# Patient Record
Sex: Male | Born: 1935 | Race: Black or African American | Hispanic: No | Marital: Married | Smoking: Never smoker
Health system: Southern US, Community
[De-identification: ages and names within clinical notes are randomized; demographics above are authoritative.]

## PROBLEM LIST (undated history)

## (undated) DIAGNOSIS — M1990 Unspecified osteoarthritis, unspecified site: Secondary | ICD-10-CM

## (undated) DIAGNOSIS — I10 Essential (primary) hypertension: Secondary | ICD-10-CM

## (undated) DIAGNOSIS — I712 Thoracic aortic aneurysm, without rupture: Secondary | ICD-10-CM

## (undated) DIAGNOSIS — I7121 Aneurysm of the ascending aorta, without rupture: Secondary | ICD-10-CM

## (undated) DIAGNOSIS — E785 Hyperlipidemia, unspecified: Secondary | ICD-10-CM

## (undated) DIAGNOSIS — C61 Malignant neoplasm of prostate: Secondary | ICD-10-CM

## (undated) DIAGNOSIS — I351 Nonrheumatic aortic (valve) insufficiency: Secondary | ICD-10-CM

## (undated) DIAGNOSIS — N189 Chronic kidney disease, unspecified: Secondary | ICD-10-CM

## (undated) HISTORY — DX: Thoracic aortic aneurysm, without rupture: I71.2

## (undated) HISTORY — DX: Chronic kidney disease, unspecified: N18.9

## (undated) HISTORY — DX: Nonrheumatic aortic (valve) insufficiency: I35.1

## (undated) HISTORY — PX: CATARACT EXTRACTION, BILATERAL: SHX1313

## (undated) HISTORY — DX: Hyperlipidemia, unspecified: E78.5

## (undated) HISTORY — DX: Aneurysm of the ascending aorta, without rupture: I71.21

## (undated) HISTORY — DX: Essential (primary) hypertension: I10

## (undated) HISTORY — DX: Malignant neoplasm of prostate: C61

## (undated) HISTORY — DX: Unspecified osteoarthritis, unspecified site: M19.90

---

## 2001-08-27 ENCOUNTER — Encounter: Payer: Self-pay | Admitting: Cardiothoracic Surgery

## 2001-08-27 ENCOUNTER — Encounter: Admission: RE | Admit: 2001-08-27 | Discharge: 2001-08-27 | Payer: Self-pay | Admitting: Cardiothoracic Surgery

## 2002-11-17 ENCOUNTER — Encounter: Payer: Self-pay | Admitting: Geriatric Medicine

## 2002-11-17 ENCOUNTER — Encounter: Admission: RE | Admit: 2002-11-17 | Discharge: 2002-11-17 | Payer: Self-pay | Admitting: Geriatric Medicine

## 2003-05-26 ENCOUNTER — Encounter: Payer: Self-pay | Admitting: Cardiothoracic Surgery

## 2003-05-26 ENCOUNTER — Encounter: Admission: RE | Admit: 2003-05-26 | Discharge: 2003-05-26 | Payer: Self-pay | Admitting: Cardiothoracic Surgery

## 2004-06-13 ENCOUNTER — Encounter: Admission: RE | Admit: 2004-06-13 | Discharge: 2004-06-13 | Payer: Self-pay | Admitting: Cardiothoracic Surgery

## 2005-06-10 ENCOUNTER — Encounter: Admission: RE | Admit: 2005-06-10 | Discharge: 2005-06-10 | Payer: Self-pay | Admitting: Cardiothoracic Surgery

## 2005-12-26 ENCOUNTER — Encounter: Admission: RE | Admit: 2005-12-26 | Discharge: 2005-12-26 | Payer: Self-pay | Admitting: Cardiothoracic Surgery

## 2006-12-17 ENCOUNTER — Encounter: Admission: RE | Admit: 2006-12-17 | Discharge: 2006-12-17 | Payer: Self-pay | Admitting: Cardiothoracic Surgery

## 2007-12-30 ENCOUNTER — Ambulatory Visit: Payer: Self-pay | Admitting: Cardiothoracic Surgery

## 2007-12-30 ENCOUNTER — Encounter: Admission: RE | Admit: 2007-12-30 | Discharge: 2007-12-30 | Payer: Self-pay | Admitting: Cardiothoracic Surgery

## 2008-12-15 ENCOUNTER — Ambulatory Visit: Payer: Self-pay | Admitting: Cardiothoracic Surgery

## 2010-01-11 ENCOUNTER — Encounter: Admission: RE | Admit: 2010-01-11 | Discharge: 2010-01-11 | Payer: Self-pay | Admitting: Cardiothoracic Surgery

## 2010-01-11 ENCOUNTER — Ambulatory Visit: Payer: Self-pay | Admitting: Cardiothoracic Surgery

## 2010-12-23 ENCOUNTER — Encounter: Payer: Self-pay | Admitting: Cardiothoracic Surgery

## 2011-01-23 ENCOUNTER — Other Ambulatory Visit: Payer: Self-pay | Admitting: Cardiothoracic Surgery

## 2011-01-23 DIAGNOSIS — I712 Thoracic aortic aneurysm, without rupture: Secondary | ICD-10-CM

## 2011-01-24 ENCOUNTER — Encounter (INDEPENDENT_AMBULATORY_CARE_PROVIDER_SITE_OTHER): Payer: Medicare Other | Admitting: Cardiothoracic Surgery

## 2011-01-24 ENCOUNTER — Ambulatory Visit
Admission: RE | Admit: 2011-01-24 | Discharge: 2011-01-24 | Disposition: A | Discharge status: Home / Self Care | Payer: Medicare Other | Source: Ambulatory Visit | Attending: Cardiothoracic Surgery | Admitting: Cardiothoracic Surgery

## 2011-01-24 DIAGNOSIS — I712 Thoracic aortic aneurysm, without rupture: Secondary | ICD-10-CM

## 2011-01-25 NOTE — Assessment & Plan Note (Addendum)
OFFICE VISIT  Corey Parker, Corey Parker DOB:  [birth year]                                        January 24, 2011 CHART #:  [id number]  The patient returns to the office today.  He has been followed since 1994 with a chronic type 3 aortic dissection and enlarged arch and descending aorta.  He comes in today, notes that he has no overt symptoms of heart failure.  He tries to run 3 blocks 3 days a week.  He was running 4 blocks, but he is unable to do this.  He denies shortness of breath.  He denies chest pain.  On exam, his blood pressure 135/80, pulse 86, respiratory rate 16, O2 sats 96%.  His lungs are clear bilaterally.  He is awake, alert, neurologically intact, appears younger than his stated age.  He is now 76.  He does appear slightly more feeble than he did last year when he was seen.  He has more of an early ejection aortic murmur than I had appreciated in the past.  He has no pedal edema.  Abdominal exam is benign without palpable masses.  A PA and lateral chest x-ray was done that shows dilatation of his aortic knob and descending aorta.  On the basis of the chest x-ray, it appears unchanged from chest x-ray last year.  I have again discussed with him the risks and options of major surgery in the past that we have entertained, possible stent grafting but the size of his arch, he would still require a major intervention.  He is currently not interested as he was last year with any major surgical intervention being fully aware that having a dilated aorta puts him at increased risk of rupture.  I have encouraged him to discuss this with his family as he has been adamant about not wishing to proceed with any operative intervention should something catastrophic occur.  Sheliah Plane, MD Electronically Signed  EG/MEDQ  D:  01/24/2011  T:  01/25/2011  Job:  [id number]  cc:   Lyn Records, M.D. Hal T. Pete Glatter, MD

## 2011-04-16 NOTE — Assessment & Plan Note (Signed)
OFFICE VISIT   Corey Parker, Corey Parker  DOB:  [birth year]                                        December 15, 2008  CHART #:  [id number]   The patient returns to the office today in followup.  I have followed  him since 1994 with a chronic type-3 aortic dissection.  He is now 77  years old and continues to remain very mobile.  He notes that he has  been sticking to his diet of fish, chicken, and lots of vegetables and  fruit and no red meat, and being diligent about control on his blood  pressure which is 117/69.  On previous CT scans and chest x-ray, he has  obvious significant dilatation of his aortic arch and descending aorta.  On previous scans with idea of a stent grafting, the size of the aorta  especially proximally and distally would make stent grafting difficult.  In addition, the landing zones in the arch would require complex arch  reconstruction.  The patient has not been interested in surgical  approach as he has gotten older, it has been less likely to agree at  this and I completely agree with his decision.   PHYSICAL EXAMINATION:  VITAL SIGNS:  Today, his blood pressure is  117/69, pulse is 80, respiratory rate 18, and O2 sats 98%.  CARDIAC:  He has early systolic murmur 2/6 and a slight diastolic rumble  heard along the left sternal border.  ABDOMEN:  Benign without palpable masses.  The abdominal aorta is not  palpably enlarged.  EXTREMITIES:  He has palpable distal, brachial, and pedal pulses.   He continues on Zestoretic, Calan, and Lipitor drops.   Overall, he seems to be doing remarkably well, possibly slightly more  unsteady on his feet than it have been in the past, but very mobile.  Again, reviewed with him the risks and options  involved in arch and descending aneurysm repair.  At his age, would not  recommend intervention or is he interested.  I will plan to see him back  in 1 year.   Sheliah Plane, MD  Electronically Signed   EG/MEDQ  D:  12/15/2008  T:  12/15/2008  Job:  [id number]   cc:   Hal T. Stoneking, M.D.  Lyn Records, M.D.

## 2011-04-16 NOTE — Assessment & Plan Note (Signed)
OFFICE VISIT   ANTOINE, VANDERMEULEN  DOB:  1916/05/18                                        January 11, 2010  CHART #:  [id number]   HISTORY:  The patient returns to the office today with a followup chest  x-ray.  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  Over the years, we had  followed him with serial CT scans.  He has now turned 77 year old and  after a discussion with him over the past several years, he has not had  any interest in having any surgical intervention done.  With this in  light, we had stopped getting yearly CT scans, but continued to  following.   PHYSICAL EXAMINATION:  He continues to be relatively active and gets  around under his own ability.  On exam today, his blood pressure 134/83,  pulse is 82, respiratory rate is 18, and O2 sats 96%.  He has early  systolic ejection murmur along the left sternal border.  No murmur of  aortic insufficiency.  Lungs are clear bilaterally.  Abdominal exam is  benign without palpable mass.   Chest x-ray is unchanged and does show diffuse dilatation of his entire  thoracic aorta.   IMPRESSION:  Again, the patient declines any surgical intervention.  We  will plan to see him back in 1 year.   Sheliah Plane, MD  Electronically Signed   EG/MEDQ  D:  01/11/2010  T:  01/12/2010  Job:  [id number]   cc:   Hal T. Stoneking, M.D.  Lyn Records, M.D.

## 2011-04-16 NOTE — Assessment & Plan Note (Signed)
OFFICE VISIT   DEJAUN, VIDRIO  DOB:  27-Jan-1916                                        December 30, 2007  CHART #:  [id number]   HISTORY OF PRESENT ILLNESS:  Mr. Rosenbloom returns to the office today for  followup visit. I have followed him since 1994 with a chronic type 3  aortic dissection. He is now 78 years old. He continues to be very lucid  and active but does limit his activities to some degree. He returns  today with a chest x-ray. He has had no symptoms of back pain.   PHYSICAL EXAMINATION:  VITAL SIGNS:  Blood pressure is 116/70, pulse 87,  respiratory rate 18. O2 sat is 98%.   MEDICATIONS:  He continues on his Zestril 20/25, Calan 240 a day,  Lipitor 10 daily, and eye drops.   DIAGNOSTIC STUDIES:  Followup chest x-ray shows diffuse dilatation of  the aorta. On measurements based just on chest x-ray, it appears  unchanged from the previous year. The patient has had CT scans in the  past and as stent grafting became available, repeat scan was also done,  however, the patient's aortic anatomy is not suitable for stent  grafting.   I have reviewed with the patient, his diagnosis, which he is very aware  of. He is aware that it could rupture but is satisfied to continue with  no operative intervention. At this point, over the years, it appears  that we have made correct choice of not operating on him, as if he had  any  complications initially from surgery, he has done very well for now 15  years without difficulty. Will plan to see the patient back in 1 year.   Sheliah Plane, MD  Electronically Signed   EG/MEDQ  D:  12/30/2007  T:  12/30/2007  Job:  [id number]   cc:   Lyn Records, M.D.

## 2012-01-23 ENCOUNTER — Ambulatory Visit: Payer: Medicare Other | Admitting: Cardiothoracic Surgery

## 2012-01-28 ENCOUNTER — Other Ambulatory Visit: Payer: Self-pay | Admitting: Cardiothoracic Surgery

## 2012-01-28 DIAGNOSIS — I712 Thoracic aortic aneurysm, without rupture: Secondary | ICD-10-CM

## 2012-01-30 ENCOUNTER — Ambulatory Visit (INDEPENDENT_AMBULATORY_CARE_PROVIDER_SITE_OTHER): Payer: Medicare Other | Admitting: Cardiothoracic Surgery

## 2012-01-30 ENCOUNTER — Encounter: Payer: Self-pay | Admitting: Cardiothoracic Surgery

## 2012-01-30 ENCOUNTER — Ambulatory Visit
Admission: RE | Admit: 2012-01-30 | Discharge: 2012-01-30 | Disposition: A | Discharge status: Home / Self Care | Payer: Medicare Other | Source: Ambulatory Visit | Attending: Cardiothoracic Surgery | Admitting: Cardiothoracic Surgery

## 2012-01-30 VITALS — BP 146/89 | HR 86 | Resp 16 | Ht 71.0 in | Wt 191.0 lb

## 2012-01-30 DIAGNOSIS — I712 Thoracic aortic aneurysm, without rupture: Secondary | ICD-10-CM

## 2012-01-30 NOTE — Progress Notes (Signed)
[address]            [place] [address]          [phone]      [facility] Medical Record #[id number] Date of Birth: 1916/10/27  Referring: Lesleigh Noe, MD Primary Care: Dr Pete Glatter  Chief Complaint:    Chief Complaint  Patient presents with  . Thoracic Aortic Aneurysm    1 yr f/u with cxr    History of Present Illness:    The patient's been followed since 1994 with a type III aortic dissection and over the years his slowly but developed a thoracoabdominal aneurysm. The patient is aware of the diagnosis and the wrists and options of treatment. He returns today for followup visit since I saw him last year he notes a few changes in his health status. He does note that he is not able to do the "fast dance" when out with his wife.         History   Social History  . Marital Status: Married    Spouse Name: N/A    Number of Children: N/A  . Years of Education: N/A   Occupational History  . Not on file.   Social History Main Topics  . Smoking status: Never Smoker   . Smokeless tobacco: Never Used  . Alcohol Use: No  . Drug Use: Not on file  . Sexually Active: Not on file   Other Topics Concern  . Not on file   Social History Narrative  . No narrative on file    History  Smoking status  . Never Smoker   Smokeless tobacco  . Never Used    History  Alcohol Use No     Not on File  Current Outpatient Prescriptions  Medication Sig Dispense Refill  . atorvastatin (LIPITOR) 10 MG tablet Take 10 mg by mouth daily.      . bimatoprost (LUMIGAN) 0.03 % ophthalmic solution Place 1 drop into both eyes 2 (two) times daily.      . brinzolamide (AZOPT) 1 % ophthalmic suspension Place 1 drop into both eyes 2 (two) times daily.      . diazepam (VALIUM) 5 MG tablet Take 5 mg by mouth every 6 (six) hours as needed. 1/2 tab as needed      . ferrous sulfate 325 (65 FE) MG tablet Take 325 mg by mouth daily with  breakfast.      . hydrochlorothiazide (MICROZIDE) 12.5 MG capsule Take 12.5 mg by mouth daily.      . multivitamin (THERAGRAN) per tablet Take 1 tablet by mouth daily.      . verapamil (CALAN-SR) 240 MG CR tablet Take 240 mg by mouth 2 (two) times daily.            Physical Exam: BP 146/89  Pulse 86  Resp 16  Ht 5\' 11"  (1.803 m)  Wt 191 lb (86.637 kg)  BMI 26.64 kg/m2  SpO2 95%  General appearance: alert and cooperative Neurologic: intact Heart: systolic murmur: early systolic 3/6, crescendo at 2nd left intercostal space Lungs: clear to auscultation bilaterally Abdomen: soft, non-tender; bowel sounds normal; no masses,  no organomegaly Extremities: extremities normal, atraumatic, no cyanosis or edema   Diagnostic Studies & Laboratory data:     Recent Radiology Findings:   Dg Chest 2 View  01/30/2012  *RADIOLOGY REPORT*  Clinical Data: Follow up of thoracic aortic aneurysm, history prostate carcinoma  CHEST - 2 VIEW  Comparison: Chest x-ray of 01/24/2011  Findings: The dilatation of the aortic knob has not changed significantly, measuring 82 mm in width on the frontal projection compared to 81 mm previously.  The lungs are clear.  There are calcified left hilar nodes present.  Cardiomegaly is stable.  There are degenerative changes throughout the thoracic spine.  IMPRESSION:  1.  Stable dilatation of the aortic knob. 2.  Stable cardiomegaly. 3.  No active lung disease.  Original Report Authenticated By: Juline Patch, M.D.      Recent Lab Findings: No results found for this basename: WBC, HGB, HCT, PLT, GLUCOSE, CHOL, TRIG, HDL, LDLDIRECT, LDLCALC, ALT, AST, NA, K, CL, CREATININE, BUN, CO2, TSH, INR, GLUF, HGBA1C      Assessment / Plan:     78 year old male with stable appearance of a large aortic arch aneurysm from a previous type III aortic dissection. I discussed in detail on several occasions with the patient the diagnosis and treatment options. He notes that he is now 78  years old and is at about not having any invasive procedure done on the arch aneurysm. Considering his age and the magnitude of treatment options I concur with this decision and continued to follow him in a palliative manner.    Delight Ovens MD  Beeper [phone] Office [phone] 01/30/2012 4:00 PM

## 2013-01-27 ENCOUNTER — Other Ambulatory Visit: Payer: Self-pay | Admitting: *Deleted

## 2013-01-28 ENCOUNTER — Ambulatory Visit
Admission: RE | Admit: 2013-01-28 | Discharge: 2013-01-28 | Disposition: A | Discharge status: Home / Self Care | Payer: Medicare Other | Source: Ambulatory Visit | Attending: Cardiothoracic Surgery | Admitting: Cardiothoracic Surgery

## 2013-01-28 ENCOUNTER — Ambulatory Visit (INDEPENDENT_AMBULATORY_CARE_PROVIDER_SITE_OTHER): Payer: Medicare Other | Admitting: Cardiothoracic Surgery

## 2013-01-28 ENCOUNTER — Encounter: Payer: Self-pay | Admitting: Cardiothoracic Surgery

## 2013-01-28 VITALS — BP 140/88 | HR 87 | Resp 20 | Ht 71.0 in | Wt 191.0 lb

## 2013-01-28 DIAGNOSIS — I7100 Dissection of unspecified site of aorta: Secondary | ICD-10-CM

## 2013-01-28 DIAGNOSIS — E785 Hyperlipidemia, unspecified: Secondary | ICD-10-CM

## 2013-01-28 DIAGNOSIS — C61 Malignant neoplasm of prostate: Secondary | ICD-10-CM

## 2013-01-28 DIAGNOSIS — N189 Chronic kidney disease, unspecified: Secondary | ICD-10-CM

## 2013-01-28 DIAGNOSIS — M129 Arthropathy, unspecified: Secondary | ICD-10-CM

## 2013-01-28 DIAGNOSIS — I716 Thoracoabdominal aortic aneurysm, without rupture: Secondary | ICD-10-CM

## 2013-01-28 DIAGNOSIS — I719 Aortic aneurysm of unspecified site, without rupture: Secondary | ICD-10-CM

## 2013-01-28 DIAGNOSIS — IMO0001 Reserved for inherently not codable concepts without codable children: Secondary | ICD-10-CM

## 2013-01-28 DIAGNOSIS — I359 Nonrheumatic aortic valve disorder, unspecified: Secondary | ICD-10-CM

## 2013-01-28 DIAGNOSIS — I10 Essential (primary) hypertension: Secondary | ICD-10-CM

## 2013-01-28 NOTE — Progress Notes (Signed)
[address]       [place] [address]             [phone]         Corey Parker [facility] Medical Record #[id number] Date of Birth: 03-18-1916  Referring: Lesleigh Noe, MD Primary Care: Dr Pete Glatter  Chief Complaint:    Chief Complaint  Patient presents with  . Thoracic Aortic Aneurysm    1 year f/u with CXR,  surveillance of aortic arch aneurysm    History of Present Illness:    The patient's been followed since 1994 with a type III aortic dissection and over the years his slowly but developed an arch/ thoracoabdominal aneurysm. The patient is aware of the diagnosis and the risks and options of treatment. He returns today for followup visit since I saw him last year he notes a few changes in his health status. He comes in today in a wheelchair. This is the first time since 1994 that seen in a wheelchair. He notes that his right knee has been bothering significantly making it difficult to walk. He has an appointment with Dr. Pete Glatter this afternoon to evaluate his right knee.  He denies any chest pain.      History   Social History  . Marital Status: Married    Spouse Name: N/A    Number of Children: 1  . Years of Education: N/A   Occupational History  . Not on file.   Social History Main Topics  . Smoking status: Never Smoker   . Smokeless tobacco: Never Used  . Alcohol Use: No  . Drug Use: Not on file  . Sexually Active: Not on file   Other Topics Concern  . Not on file   Social History Narrative  . No narrative on file    History  Smoking status  . Never Smoker   Smokeless tobacco  . Never Used    History  Alcohol Use No     No Known Allergies  Current Outpatient Prescriptions  Medication Sig Dispense Refill  . atorvastatin (LIPITOR) 10 MG tablet Take 10 mg by mouth daily.      . bimatoprost (LUMIGAN) 0.03 % ophthalmic solution Place 1 drop into both eyes 2 (two) times daily.      . brinzolamide (AZOPT) 1 %  ophthalmic suspension Place 1 drop into both eyes 2 (two) times daily.      . diazepam (VALIUM) 5 MG tablet Take 5 mg by mouth every 6 (six) hours as needed. 1/2 tab as needed      . ferrous sulfate 325 (65 FE) MG tablet Take 325 mg by mouth daily with breakfast.      . hydrochlorothiazide (MICROZIDE) 12.5 MG capsule Take 12.5 mg by mouth daily.      . multivitamin (THERAGRAN) per tablet Take 1 tablet by mouth daily.      . tamsulosin (FLOMAX) 0.4 MG CAPS Take 0.4 mg by mouth daily.      . verapamil (CALAN-SR) 240 MG CR tablet Take 240 mg by mouth 2 (two) times daily.       No current facility-administered medications for this visit.        Physical Exam: BP 140/88  Pulse 87  Resp 20  Ht 5\' 11"  (1.803 m)  Wt 191 lb (86.637 kg)  BMI 26.65 kg/m2  SpO2 94%  General appearance: alert and cooperative, patient is very cognitive of his medical care. Neurologic: intact Heart: systolic  murmur: early systolic 3/6, crescendo at 2nd left intercostal space Lungs: clear to auscultation bilaterally Abdomen: soft, non-tender; bowel sounds normal; no masses,  no organomegaly Extremities: His right knee is swollen with obvious effusion there is no erythema to suggest infection.   Diagnostic Studies & Laboratory data:     Recent Radiology Findings:   Dg Chest 2 View  01/28/2013  *RADIOLOGY REPORT*  Clinical Data: Follow-up evaluation of thoracic aortic aneurysm.  CHEST - 2 VIEW  Comparison: Chest x-ray 01/30/2012.  Findings: Lung volumes are normal.  No consolidative airspace disease.  No pleural effusions.  Pulmonary vasculature is within normal limits.  Multiple calcified left hilar lymph nodes are again noted.  Heart size is borderline enlarged, with prominence of the left ventricular contour, suggestive of left ventricular hypertrophy.  Atherosclerosis and diffuse aneurysmal dilatation of the thoracic aorta is again noted, which appears to measure up to approximately 8.2 cm in diameter at the  level of the aortic arch.  IMPRESSION: 1.  Marked aneurysmal dilatation of the thoracic aorta is similar in appearance to prior chest radiograph 01/30/2012.  Accurate measurement of the aneurysm is limited on the plain film examination, and could be better achieved with a CTA of the thorax if clinically indicated.   Original Report Authenticated By: Trudie Reed, M.D.       Recent Lab Findings: No results found for this basename: WBC,  HGB,  HCT,  PLT,  GLUCOSE,  CHOL,  TRIG,  HDL,  LDLDIRECT,  LDLCALC,  ALT,  AST,  NA,  K,  CL,  CREATININE,  BUN,  CO2,  TSH,  INR,  GLUF,  HGBA1C      Assessment / Plan:     79 year old male with stable appearance of a large aortic arch aneurysm from a previous type III aortic dissection. I discussed in detail on several occasions with the patient the diagnosis and treatment options. He notes that he is now 79 years old and is at about not having any invasive procedure done on the arch aneurysm. Considering his age and the magnitude of treatment options I concur with this decision and continued to follow him in a palliative manner. Currently his biggest limiting factor is his painful right knee he artery has an appointment to see Dr. Pete Glatter this afternoon.    Delight Ovens MD  Beeper [phone] Office [phone] 01/28/2013 12:34 PM

## 2013-01-28 NOTE — Progress Notes (Signed)
[address]       [place] [address]             [phone]       Corey Parker [facility] Medical Record #[id number] Date of Birth: Jun 10, 1916  Referring: Lesleigh Noe, MD Primary Care: Dr Pete Glatter  Chief Complaint:    Chief Complaint  Patient presents with  . Thoracic Aortic Aneurysm    1 year f/u with CXR,  surveillance of aortic arch aneurysm    History of Present Illness:    The patient's been followed since 1994 with a type III aortic dissection and over the years his slowly but developed a thoracoabdominal aneurysm. The patient is aware of the diagnosis and the wrists and options of treatment. He returns today for followup visit since I saw him last year he notes a few changes in his health status. He does note that he is not able to do the "fast dance" when out with his wife.         History   Social History  . Marital Status: Married    Spouse Name: N/A    Number of Children: 1  . Years of Education: N/A   Occupational History  . Not on file.   Social History Main Topics  . Smoking status: Never Smoker   . Smokeless tobacco: Never Used  . Alcohol Use: No  . Drug Use: Not on file  . Sexually Active: Not on file   Other Topics Concern  . Not on file   Social History Narrative  . No narrative on file    History  Smoking status  . Never Smoker   Smokeless tobacco  . Never Used    History  Alcohol Use No     No Known Allergies  Current Outpatient Prescriptions  Medication Sig Dispense Refill  . atorvastatin (LIPITOR) 10 MG tablet Take 10 mg by mouth daily.      . bimatoprost (LUMIGAN) 0.03 % ophthalmic solution Place 1 drop into both eyes 2 (two) times daily.      . brinzolamide (AZOPT) 1 % ophthalmic suspension Place 1 drop into both eyes 2 (two) times daily.      . diazepam (VALIUM) 5 MG tablet Take 5 mg by mouth every 6 (six) hours as needed. 1/2 tab as needed      . ferrous sulfate 325 (65  FE) MG tablet Take 325 mg by mouth daily with breakfast.      . hydrochlorothiazide (MICROZIDE) 12.5 MG capsule Take 12.5 mg by mouth daily.      . multivitamin (THERAGRAN) per tablet Take 1 tablet by mouth daily.      . tamsulosin (FLOMAX) 0.4 MG CAPS Take 0.4 mg by mouth daily.      . verapamil (CALAN-SR) 240 MG CR tablet Take 240 mg by mouth 2 (two) times daily.       No current facility-administered medications for this visit.        Physical Exam: BP 140/88  Pulse 87  Resp 20  Ht 5\' 11"  (1.803 m)  Wt 191 lb (86.637 kg)  BMI 26.65 kg/m2  SpO2 94%  General appearance: alert and cooperative Neurologic: intact Heart: systolic murmur: early systolic 3/6, crescendo at 2nd left intercostal space Lungs: clear to auscultation bilaterally Abdomen: soft, non-tender; bowel sounds normal; no masses,  no organomegaly  Extremities: extremities normal, atraumatic, no cyanosis or edema   Diagnostic Studies & Laboratory data:     Recent Radiology Findings:   Dg Chest 2 View  01/28/2013  *RADIOLOGY REPORT*  Clinical Data: Follow-up evaluation of thoracic aortic aneurysm.  CHEST - 2 VIEW  Comparison: Chest x-ray 01/30/2012.  Findings: Lung volumes are normal.  No consolidative airspace disease.  No pleural effusions.  Pulmonary vasculature is within normal limits.  Multiple calcified left hilar lymph nodes are again noted.  Heart size is borderline enlarged, with prominence of the left ventricular contour, suggestive of left ventricular hypertrophy.  Atherosclerosis and diffuse aneurysmal dilatation of the thoracic aorta is again noted, which appears to measure up to approximately 8.2 cm in diameter at the level of the aortic arch.  IMPRESSION: 1.  Marked aneurysmal dilatation of the thoracic aorta is similar in appearance to prior chest radiograph 01/30/2012.  Accurate measurement of the aneurysm is limited on the plain film examination, and could be better achieved with a CTA of the thorax if  clinically indicated.   Original Report Authenticated By: Trudie Reed, M.D.       Recent Lab Findings: No results found for this basename: WBC,  HGB,  HCT,  PLT,  GLUCOSE,  CHOL,  TRIG,  HDL,  LDLDIRECT,  LDLCALC,  ALT,  AST,  NA,  K,  CL,  CREATININE,  BUN,  CO2,  TSH,  INR,  GLUF,  HGBA1C      Assessment / Plan:     79 year old male with stable appearance of a large aortic arch aneurysm from a previous type III aortic dissection. I discussed in detail on several occasions with the patient the diagnosis and treatment options. He notes that he is now 79 years old and is at about not having any invasive procedure done on the arch aneurysm. Considering his age and the magnitude of treatment options I concur with this decision and continued to follow him in a palliative manner.    Delight Ovens MD  Beeper [phone] Office [phone] 01/28/2013 12:25 PM

## 2013-08-20 ENCOUNTER — Encounter: Payer: Self-pay | Admitting: *Deleted

## 2013-08-20 ENCOUNTER — Encounter: Payer: Self-pay | Admitting: Interventional Cardiology

## 2013-09-01 ENCOUNTER — Ambulatory Visit (INDEPENDENT_AMBULATORY_CARE_PROVIDER_SITE_OTHER): Payer: Medicare Other | Admitting: Interventional Cardiology

## 2013-09-01 ENCOUNTER — Encounter: Payer: Self-pay | Admitting: Interventional Cardiology

## 2013-09-01 VITALS — BP 163/97 | HR 91 | Ht 71.0 in | Wt 174.0 lb

## 2013-09-01 DIAGNOSIS — E785 Hyperlipidemia, unspecified: Secondary | ICD-10-CM

## 2013-09-01 DIAGNOSIS — I10 Essential (primary) hypertension: Secondary | ICD-10-CM

## 2013-09-01 DIAGNOSIS — I7100 Dissection of unspecified site of aorta: Secondary | ICD-10-CM

## 2013-09-01 DIAGNOSIS — R0600 Dyspnea, unspecified: Secondary | ICD-10-CM

## 2013-09-01 DIAGNOSIS — I359 Nonrheumatic aortic valve disorder, unspecified: Secondary | ICD-10-CM

## 2013-09-01 DIAGNOSIS — I351 Nonrheumatic aortic (valve) insufficiency: Secondary | ICD-10-CM

## 2013-09-01 DIAGNOSIS — R0609 Other forms of dyspnea: Secondary | ICD-10-CM

## 2013-09-01 LAB — BASIC METABOLIC PANEL
Chloride: 111 mEq/L (ref 96–112)
Glucose, Bld: 84 mg/dL (ref 70–99)
Potassium: 3.9 mEq/L (ref 3.5–5.1)

## 2013-09-01 LAB — CBC WITH DIFFERENTIAL/PLATELET
Basophils Absolute: 0 10*3/uL (ref 0.0–0.1)
Basophils Relative: 0.4 % (ref 0.0–3.0)
Eosinophils Absolute: 0.2 10*3/uL (ref 0.0–0.7)
Eosinophils Relative: 2.9 % (ref 0.0–5.0)
Hemoglobin: 10.5 g/dL — ABNORMAL LOW (ref 13.0–17.0)
Lymphocytes Relative: 22.4 % (ref 12.0–46.0)
MCHC: 33.4 g/dL (ref 30.0–36.0)
Monocytes Absolute: 0.5 10*3/uL (ref 0.1–1.0)
Neutro Abs: 4 10*3/uL (ref 1.4–7.7)
RBC: 3.7 Mil/uL — ABNORMAL LOW (ref 4.22–5.81)
RDW: 15.8 % — ABNORMAL HIGH (ref 11.5–14.6)
WBC: 6.1 10*3/uL (ref 4.5–10.5)

## 2013-09-01 NOTE — Progress Notes (Signed)
Patient ID: Corey Parker, male   DOB: March 05, 1916, 79 y.o.   MRN: [id number]   Primary Care Physician:  History of Present Illness: Dr. Harle Stanford is here today to followup aortic regurgitation and dilated/aneurysmal ascending aorta. He is 79 years of age and both he and the wife or not able to drive. They get here by cab today. The wife is particularly concerned that he is having memory loss and anorexia. He has lost significant weight. He denies pain. There are no cardiac symptoms. He specifically denies orthopnea, PND, palpitations, syncope, but has noted lower extremity edema.    Past Medical History  Diagnosis Date  . Chronic kidney disease     stage III  . Prostate cancer   . Aortic regurgitation   . Hypertension   . Arthritis   . Hyperlipidemia   . Ascending aortic aneurysm     Followed over time by Dr. Sheliah Plane    Past Surgical History  Procedure Laterality Date  . Cataract extraction, bilateral      Current Outpatient Prescriptions  Medication Sig Dispense Refill  . atorvastatin (LIPITOR) 10 MG tablet Take 10 mg by mouth daily.      . bimatoprost (LUMIGAN) 0.03 % ophthalmic solution Place 1 drop into both eyes 2 (two) times daily.      . diazepam (VALIUM) 5 MG tablet Take 5 mg by mouth every 6 (six) hours as needed. 1/2 tab as needed      . ferrous sulfate 325 (65 FE) MG tablet Take 325 mg by mouth daily with breakfast.      . multivitamin (THERAGRAN) per tablet Take 1 tablet by mouth daily.      . tamsulosin (FLOMAX) 0.4 MG CAPS Take 0.4 mg by mouth daily.      . TOVIAZ 4 MG TB24 tablet Take 1 tablet by mouth daily.      . verapamil (CALAN-SR) 240 MG CR tablet Take 240 mg by mouth 2 (two) times daily.       No current facility-administered medications for this visit.    No Known Allergies  History   Social History  . Marital Status: Married    Spouse Name: N/A    Number of Children: 1  . Years of Education: N/A   Occupational History  . Not on file.    Social History Main Topics  . Smoking status: Never Smoker   . Smokeless tobacco: Never Used  . Alcohol Use: No  . Drug Use: Not on file  . Sexual Activity: No   Other Topics Concern  . Not on file   Social History Narrative  . No narrative on file    Family History  Problem Relation Age of Onset  . Heart disease Mother   . Hypertension Sister   . Parkinson's disease    . Breast cancer      Review of Systems:  As stated in the HPI and otherwise negative.   BP 163/97  Pulse 91  Ht 5\' 11"  (1.803 m)  Wt 174 lb (78.926 kg)  BMI 24.28 kg/m2  Physical Examination: General: Well developed, well nourished, NAD HEENT:  no jaundice  SKIN: warm, dry. No rashes. Neuro: No focal deficits. He's tends to stare. He is pleasant. Memory is definitely decreased. He lives with his wife for advice to  Musculoskeletal: Muscle strength 5/5 all ext Psychiatric: Mood and affect normal Neck: No JVD, no carotid bruits, no thyromegaly, no lymphadenopathy. Lungs:Clear bilaterally, no wheezes, rhonci, crackles Cardiovascular: Regular  rate and rhythm. The PMI is laterally displaced. An S4 gallop is present . A 2/6 crescendo decrescendo murmur is heard at the right upper sternal border. A decrescendo 3/6 diastolic murmur of aortic regurgitation is also heard.  Abdomen:Soft. Bowel sounds present. Non-tender.  Extremities: No lower extremity edema. Pulses are 2 + in the bilateral DP/PT.  EKG: Normal sinus rhythm with left axis deviation  Assessment and Plan:   1. Aortic regurgitation, moderate in severity based on clinical exam  2. Dilated aorta/aneurysm, asymptomatic, and not recently evaluated. Not a surgical candidate at this point in his life.  3. Hypertension. Will get BMET  4. Dyspnea, and fatigue. I will obtain a BNP and CBC to make sure the symptoms are not related to heart failure.   5. Anorexia and weight loss. I am concerned about this particular problem and expressed this to the  patient and wife. They need to followup with Dr. Pete Glatter as soon as possible

## 2013-09-01 NOTE — Patient Instructions (Addendum)
Labs today: CBC, BNP, BMET  Your physician recommends that you schedule a follow-up appointment in: AS NEEDED

## 2013-09-02 ENCOUNTER — Other Ambulatory Visit: Payer: Self-pay | Admitting: Geriatric Medicine

## 2013-09-02 DIAGNOSIS — R413 Other amnesia: Secondary | ICD-10-CM

## 2013-09-03 ENCOUNTER — Telehealth: Payer: Self-pay

## 2013-09-03 NOTE — Telephone Encounter (Signed)
Pt wife given lab results.per Dr.Smitt.Results are not too bad. No evidence that the heart is causing any of the problems. Needs to f/u with the PCP ASAP. Copy of labs sent to Dr.Stoneking. Pt wife verbalized understanding

## 2013-09-03 NOTE — Telephone Encounter (Signed)
Message copied by Jarvis Newcomer on Fri Sep 03, 2013  2:53 PM ------      Message from: Verdis Prime      Created: Thu Sep 02, 2013  6:37 PM       Results are not too bad. No evidence that the heart is causing any of the problems. Needs to f/u with the PCP ASAP. Send copy of labs to PCP (?Stoneking). ------

## 2013-09-15 ENCOUNTER — Other Ambulatory Visit: Payer: Medicare Other

## 2013-12-03 ENCOUNTER — Emergency Department (HOSPITAL_COMMUNITY)
Admission: EM | Admit: 2013-12-03 | Discharge: 2014-01-02 | Disposition: E | Payer: Medicare Other | Attending: Emergency Medicine | Admitting: Emergency Medicine

## 2013-12-03 DIAGNOSIS — Z8546 Personal history of malignant neoplasm of prostate: Secondary | ICD-10-CM | POA: Insufficient documentation

## 2013-12-03 DIAGNOSIS — R05 Cough: Secondary | ICD-10-CM | POA: Insufficient documentation

## 2013-12-03 DIAGNOSIS — M129 Arthropathy, unspecified: Secondary | ICD-10-CM | POA: Insufficient documentation

## 2013-12-03 DIAGNOSIS — I129 Hypertensive chronic kidney disease with stage 1 through stage 4 chronic kidney disease, or unspecified chronic kidney disease: Secondary | ICD-10-CM | POA: Insufficient documentation

## 2013-12-03 DIAGNOSIS — N183 Chronic kidney disease, stage 3 unspecified: Secondary | ICD-10-CM | POA: Insufficient documentation

## 2013-12-03 DIAGNOSIS — Z79899 Other long term (current) drug therapy: Secondary | ICD-10-CM | POA: Insufficient documentation

## 2013-12-03 DIAGNOSIS — E785 Hyperlipidemia, unspecified: Secondary | ICD-10-CM | POA: Insufficient documentation

## 2013-12-03 DIAGNOSIS — R059 Cough, unspecified: Secondary | ICD-10-CM | POA: Insufficient documentation

## 2013-12-03 NOTE — Progress Notes (Signed)
Chaplain responded to page from ED concerning DOA. Chaplain escorted family to consult room and provided emotional support as MD notified wife, Corey Parker, and her friends, of the death. Chaplain gave pt's wife the patient placement card and told her how to make arrangements. Chaplain also escorted wife's sister back to family room. Family expressed thanks for visit.

## 2013-12-03 NOTE — ED Notes (Signed)
Pt being transported to pcp, by family, became unresponsive, Family began cpr. EMS arrival. Pt in PEA. CPR discontinued @ 1519. Pt received 7 epi, intubated enroute, i/o to left leg.

## 2013-12-03 NOTE — ED Provider Notes (Signed)
CSN: [id number]     Arrival date & time 12/20/2013  1556 History   First MD Initiated Contact with Patient 12/30/2013 1604     Chief Complaint  Patient presents with  . Dead On Arrival   history is obtained from patient's wife and from EMS (Consider location/radiation/quality/duration/timing/severity/associated sxs/prior Treatment) HPI Patient brought by EMS after he suddenly collapsed while on the way to his doctor's office today. Patient had been complaining of a cough according to his wife, when he suddenly collapsed. EMS arrived to find patient in PEA rhythm. EMS treated patient with intraosseous line epinephrine 7 mg per I. all aligned and orally intubated the patient. Patient remained in PEA during entire prehospital course. He was pronounced dead by me at 1519 p.m. in the field. Past Medical History  Diagnosis Date  . Chronic kidney disease     stage III  . Prostate cancer   . Aortic regurgitation   . Hypertension   . Arthritis   . Hyperlipidemia   . Ascending aortic aneurysm     Followed over time by Dr. Lanelle Bal   Past Surgical History  Procedure Laterality Date  . Cataract extraction, bilateral     Family History  Problem Relation Age of Onset  . Heart disease Mother   . Hypertension Sister   . Parkinson's disease    . Breast cancer     History  Substance Use Topics  . Smoking status: Never Smoker   . Smokeless tobacco: Never Used  . Alcohol Use: No    Review of Systems  Unable to perform ROS: Other    Allergies  Review of patient's allergies indicates no known allergies.  Home Medications   Current Outpatient Rx  Name  Route  Sig  Dispense  Refill  . atorvastatin (LIPITOR) 10 MG tablet   Oral   Take 10 mg by mouth daily.         . bimatoprost (LUMIGAN) 0.03 % ophthalmic solution   Both Eyes   Place 1 drop into both eyes 2 (two) times daily.         . ciprofloxacin (CIPRO) 500 MG tablet               . COMBIGAN 0.2-0.5 % ophthalmic  solution               . diazepam (VALIUM) 5 MG tablet   Oral   Take 5 mg by mouth every 6 (six) hours as needed. 1/2 tab as needed         . ferrous sulfate 325 (65 FE) MG tablet   Oral   Take 325 mg by mouth daily with breakfast.         . finasteride (PROSCAR) 5 MG tablet               . furosemide (LASIX) 20 MG tablet               . multivitamin (THERAGRAN) per tablet   Oral   Take 1 tablet by mouth daily.         . tamsulosin (FLOMAX) 0.4 MG CAPS   Oral   Take 0.4 mg by mouth daily.         . TOVIAZ 4 MG TB24 tablet   Oral   Take 1 tablet by mouth daily.         . verapamil (CALAN-SR) 240 MG CR tablet   Oral   Take 240 mg by mouth 2 (two) times  daily.          There were no vitals taken for this visit. Physical Exam  Nursing note and vitals reviewed. Constitutional:  Glasgow Coma Score 3  HENT:  Head: Normocephalic and atraumatic.  Eyes:  Pupils fixed  Neck: No tracheal deviation present. No thyromegaly present.  No signs of trauma  Cardiovascular:  No murmur heard. Peripheral pulses absent heart sounds absent  Pulmonary/Chest:  No spontaneous respiration  Abdominal: He exhibits no distension.  Musculoskeletal:  Intraosseous in left lower extremity at shin. 1+ edema pretibially bilateral  Neurological: Coordination normal.  Unresponsive Glasgow Coma Score 3  Skin: No rash noted.  COOL, brawny changes bilateral lower extremities  Psychiatric:  Unresponsive    ED Course  Procedures (including critical care time) Labs Review Labs Reviewed - No data to display Imaging Review No results found.  EKG Interpretation   None       MDM  No diagnosis found. Patient dead on arrival. Wife notified by me Spoke with Dr. Delfina Redwood Dr. Felipa Eth to sign death certificate    Orlie Dakin, MD 12/12/2013 [phone]

## 2014-01-02 DIAGNOSIS — 419620001 Death: Secondary | SNOMED CT | POA: Insufficient documentation

## 2014-01-02 DEATH — deceased

## 2014-01-27 ENCOUNTER — Ambulatory Visit: Payer: Medicare Other | Admitting: Cardiothoracic Surgery

## 2014-07-09 IMAGING — CR DG CHEST 2V
3 series · 3 of 3 positions shown · non-contrast
Comparison: Chest x-ray 01/30/2012.

CLINICAL DATA: Follow-up evaluation of thoracic aortic aneurysm.

CHEST - 2 VIEW

[w chest pa (1 of 2)]
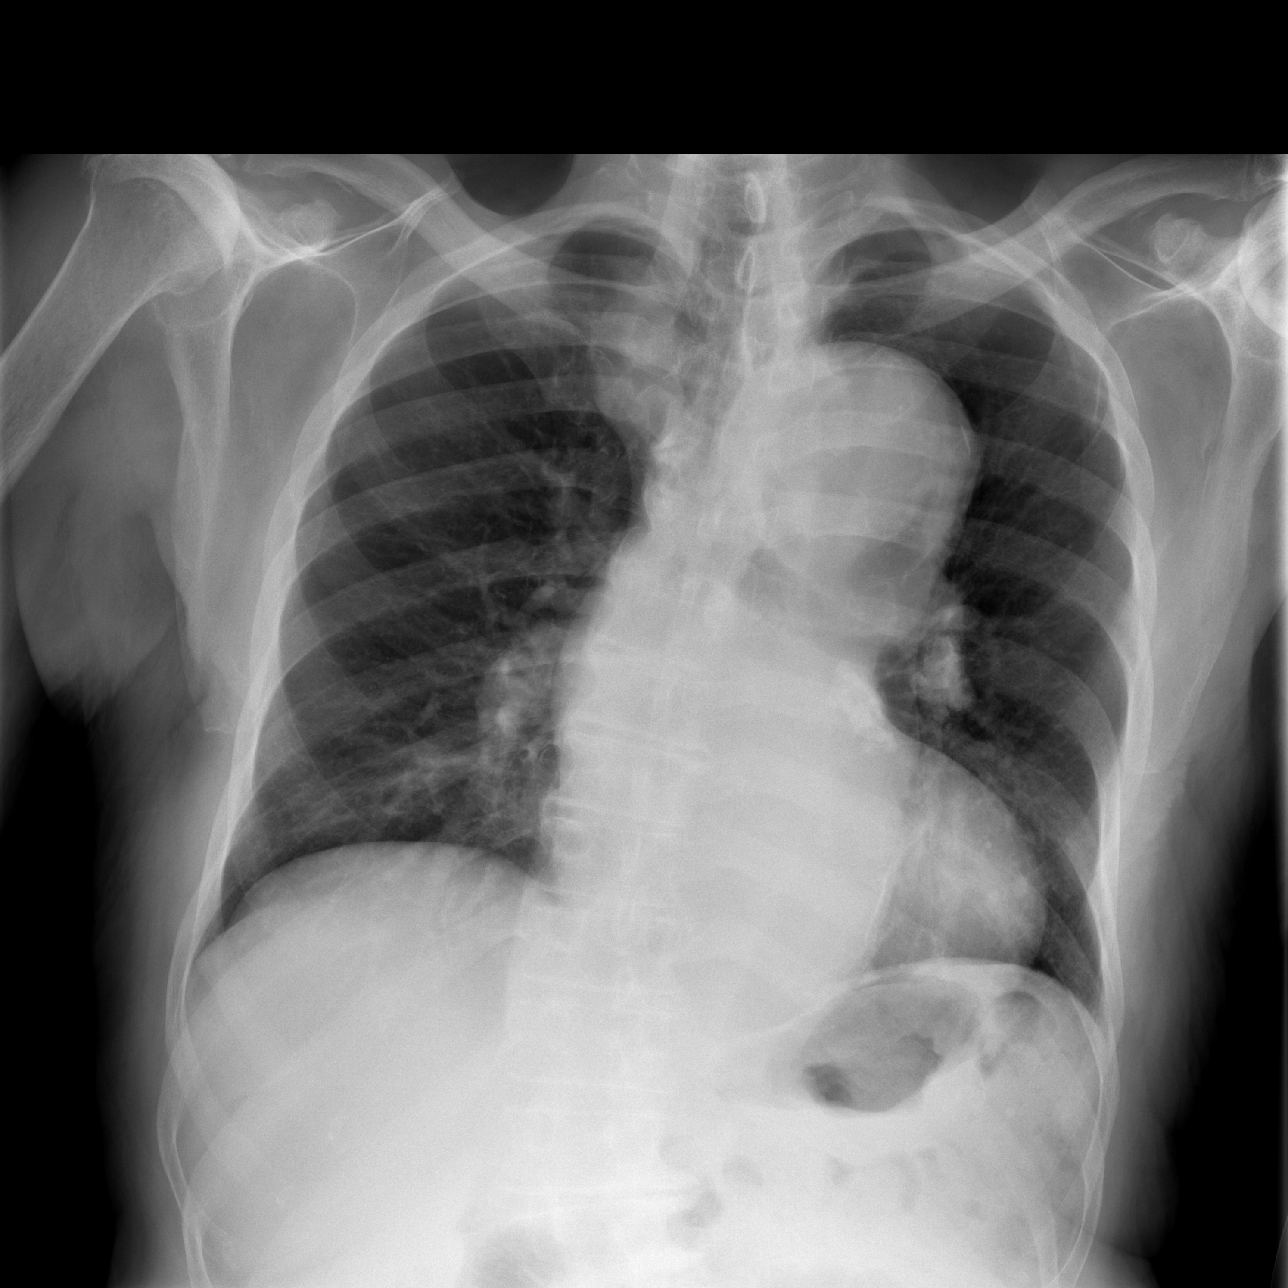

[w chest pa (2 of 2)]
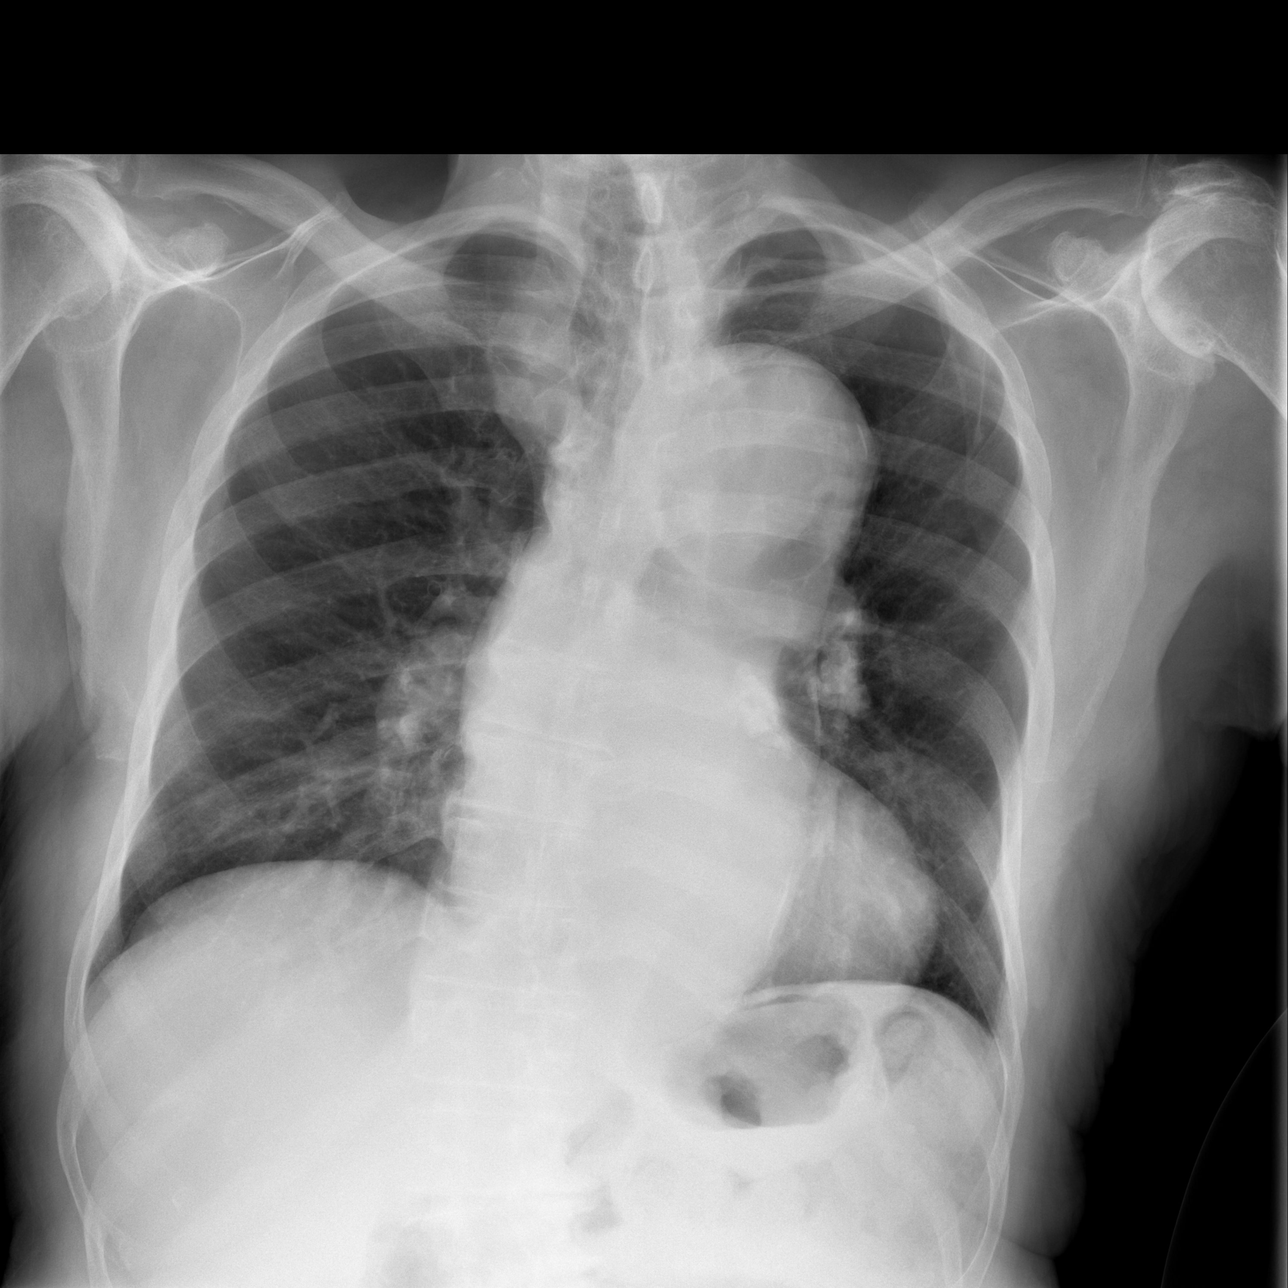

[w chest lat]
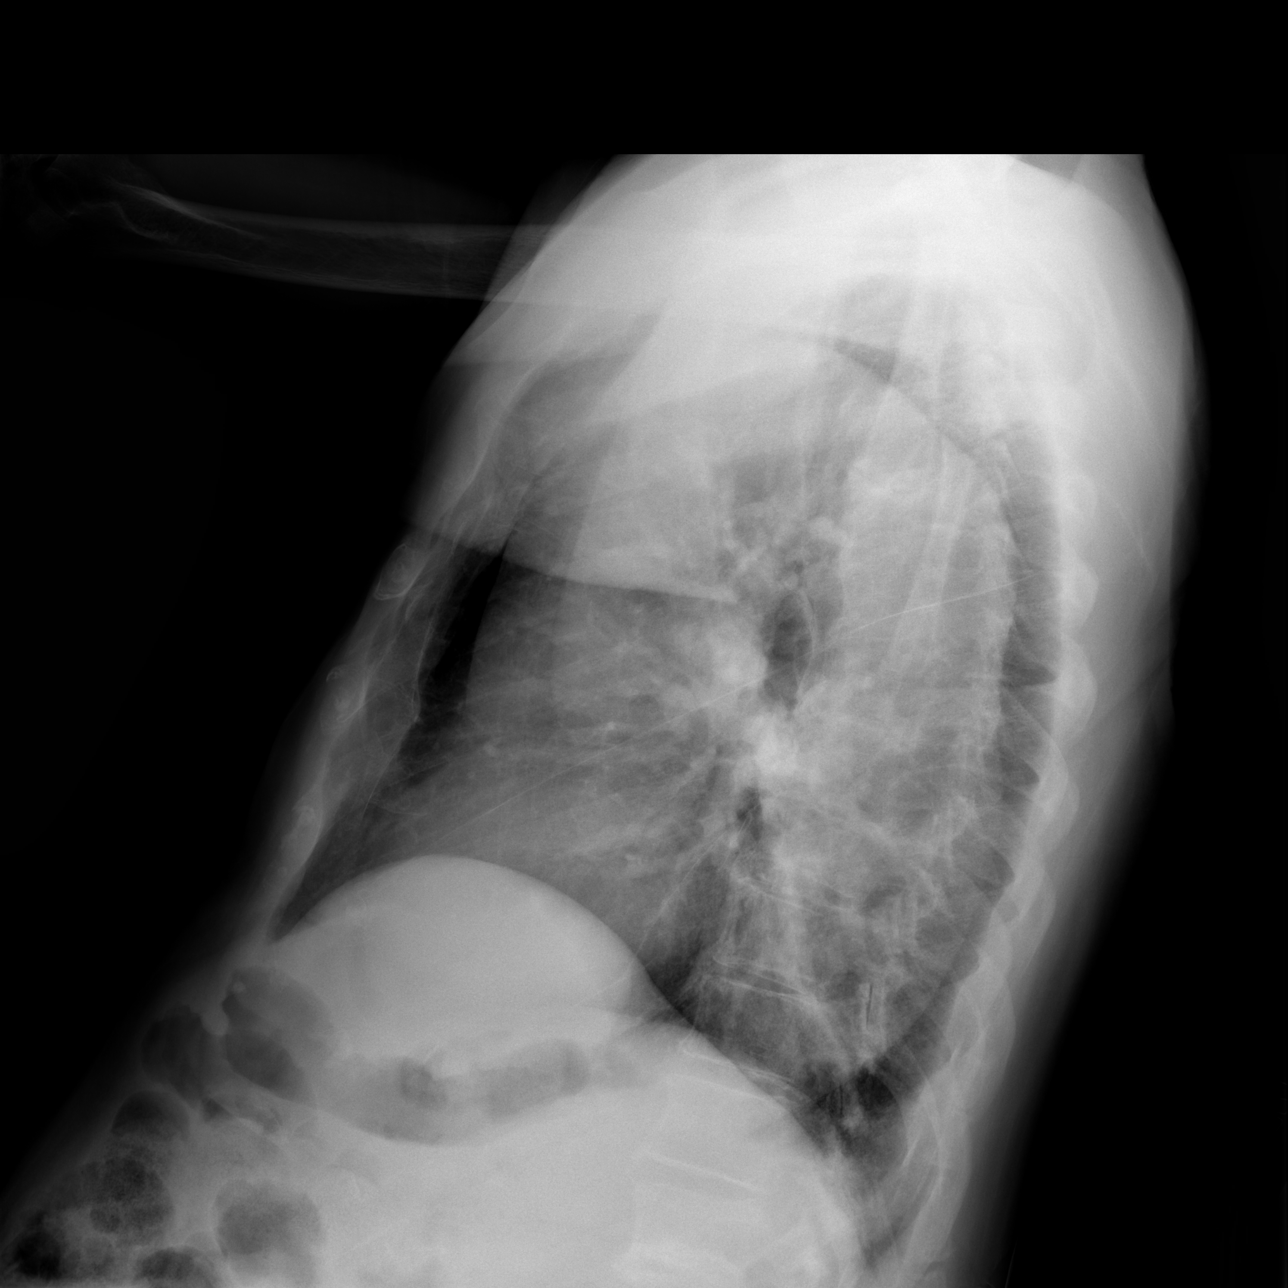

[3 of 3 positions shown; findings below may reference images not displayed]

FINDINGS: Lung volumes are normal.  No consolidative airspace
disease.  No pleural effusions.  Pulmonary vasculature is within
normal limits.  Multiple calcified left hilar lymph nodes are again
noted.  Heart size is borderline enlarged, with prominence of the
left ventricular contour, suggestive of left ventricular
hypertrophy.  Atherosclerosis and diffuse aneurysmal dilatation of
the thoracic aorta is again noted, which appears to measure up to
approximately 8.2 cm in diameter at the level of the aortic arch.
IMPRESSION: 1.  Marked aneurysmal dilatation of the thoracic aorta is similar
in appearance to prior chest radiograph 01/30/2012.  Accurate
measurement of the aneurysm is limited on the plain film
examination, and could be better achieved with a CTA of the thorax
if clinically indicated.
